# Patient Record
Sex: Male | Born: 2000
Health system: Southern US, Community
[De-identification: ages and names within clinical notes are randomized; demographics above are authoritative.]

## PROBLEM LIST (undated history)

## (undated) DIAGNOSIS — F909 Attention-deficit hyperactivity disorder, unspecified type: Secondary | ICD-10-CM

## (undated) DIAGNOSIS — J189 Pneumonia, unspecified organism: Secondary | ICD-10-CM

---
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
## 2020-03-25 ENCOUNTER — Emergency Department (HOSPITAL_BASED_OUTPATIENT_CLINIC_OR_DEPARTMENT_OTHER)
Admission: EM | Admit: 2020-03-25 | Discharge: 2020-03-25 | Disposition: A | Discharge status: Home / Self Care | Payer: 59 | Attending: Emergency Medicine | Admitting: Emergency Medicine

## 2020-03-25 ENCOUNTER — Other Ambulatory Visit: Payer: Self-pay

## 2020-03-25 ENCOUNTER — Encounter (HOSPITAL_BASED_OUTPATIENT_CLINIC_OR_DEPARTMENT_OTHER): Payer: Self-pay | Admitting: *Deleted

## 2020-03-25 DIAGNOSIS — Z202 Contact with and (suspected) exposure to infections with a predominantly sexual mode of transmission: Secondary | ICD-10-CM | POA: Diagnosis not present

## 2020-03-25 LAB — URINALYSIS, ROUTINE W REFLEX MICROSCOPIC
Bilirubin Urine: NEGATIVE
Glucose, UA: NEGATIVE mg/dL
Hgb urine dipstick: NEGATIVE
Ketones, ur: NEGATIVE mg/dL
Leukocytes,Ua: NEGATIVE
Nitrite: NEGATIVE
Protein, ur: NEGATIVE mg/dL
Specific Gravity, Urine: 1.02 (ref 1.005–1.030)
pH: 7.5 (ref 5.0–8.0)

## 2020-03-25 MED ORDER — CEFTRIAXONE SODIUM 500 MG IJ SOLR
500.0000 mg | Freq: Once | INTRAMUSCULAR | Status: AC
  Administered 2020-03-25: 500 mg via INTRAMUSCULAR
  Filled 2020-03-25: qty 500

## 2020-03-25 MED ORDER — DOXYCYCLINE HYCLATE 100 MG PO CAPS: 100.0000 mg | ORAL_CAPSULE | Freq: Two times a day (BID) | ORAL | 0 refills | Status: AC

## 2020-03-25 MED ORDER — LIDOCAINE HCL (PF) 1 % IJ SOLN
1.0000 mL | Freq: Once | INTRAMUSCULAR | Status: AC
  Administered 2020-03-25: 1 mL
  Filled 2020-03-25: qty 5

## 2020-03-25 NOTE — Discharge Instructions (Addendum)
.  You been seen here for STD exposure.  I have treated you for gonorrhea and chlamydia, you must abstain from sexual intercourse for the next 7 days.  I recommend safe sex practices to prevent future exposures.    If you have a future exposure he can follow-up at the local health department as they can screen and treat you.  Come back to the emergency department if you develop chest pain, shortness of breath, severe abdominal pain, uncontrolled nausea, vomiting, diarrhea.

## 2020-03-25 NOTE — ED Provider Notes (Signed)
[facility] EMERGENCY DEPARTMENT Provider Note   CSN: [id number] Arrival date & time: 03/25/20  1448     History Chief Complaint  Patient presents with  . Exposure to STD    Kenneth Roman is a 20 y.o. male.  HPI   Patient with no significant medical history presents to the emergency department with chief complaint of STD exposure.  Patient endorses that his partner recently tested positive for chlamydia and he was instructed to  come get checked out.  He denies any symptoms at this time, he denies penile discharge, testicular pain, urinary urgency, frequency, hematuria, dysuria, abdominal pain, flank pain, nausea or vomiting.  He states he is really here just to get tested and treated if necessary.  He denies alleviating factors, he is not immunocompromise.  Patient denies headaches, fevers, chills, shortness of breath, chest pain, abdominal pain, urinary symptoms, pedal edema.  Past Medical History:  Diagnosis Date  . ADHD (attention deficit hyperactivity disorder)   . Pneumonia     There are no problems to display for this patient.   History reviewed. No pertinent surgical history.     No family history on file.  Social History   Tobacco Use  . Smoking status: Never Smoker  Substance Use Topics  . Alcohol use: No  . Drug use: No    Home Medications Prior to Admission medications   Medication Sig Start Date End Date Taking? Authorizing Provider  doxycycline (VIBRAMYCIN) 100 MG capsule Take 1 capsule (100 mg total) by mouth 2 (two) times daily for 7 days. 03/25/20 04/01/20 Yes Carroll Sage, PA-C  azithromycin [facility]) 200 MG/5ML suspension Take 350mg  (9 mL) day 1, then 175 mg (4.5 mL) daily for the next 4 days. 04/05/12   04/07/12, MD  dextromethorphan-guaiFENesin (MUCINEX DM) 30-600 MG 12hr tablet Take 1 tablet by mouth 2 (two) times daily. 03/02/18   Law, 03/04/18, PA-C  guaiFENesin-codeine (ROBITUSSIN AC) 100-10 MG/5ML syrup Take 5 mLs  by mouth 3 (three) times daily as needed for cough. 04/05/12   04/07/12, MD  HYDROcodone-acetaminophen (NORCO/VICODIN) 5-325 MG per tablet Take 1 tablet by mouth every 4 (four) hours as needed. 02/24/13   02/26/13, PA-C  methylphenidate (CONCERTA) 27 MG CR tablet Take 27 mg by mouth every morning.    [provider]    Allergies    Patient has no known allergies.  Review of Systems   Review of Systems  Constitutional: Negative for chills and fever.  HENT: Negative for congestion.   Respiratory: Negative for shortness of breath.   Cardiovascular: Negative for chest pain.  Gastrointestinal: Negative for abdominal pain.  Genitourinary: Negative for dysuria, enuresis, penile swelling, scrotal swelling and testicular pain.  Musculoskeletal: Negative for back pain.  Skin: Negative for rash.  Neurological: Negative for dizziness.  Hematological: Does not bruise/bleed easily.    Physical Exam Updated Vital Signs BP 124/78   Pulse 63   Temp 98.5 F (36.9 C) (Oral)   Resp 16   Ht 6\' 1"  (1.854 m)   Wt 72.6 kg   SpO2 100%   BMI 21.11 kg/m   Physical Exam Vitals and nursing note reviewed. Exam conducted with a chaperone present.  Constitutional:      General: He is not in acute distress.    Appearance: He is not ill-appearing.  HENT:     Head: Normocephalic and atraumatic.     Nose: No congestion.  Eyes:     Conjunctiva/sclera: Conjunctivae normal.  Cardiovascular:     Rate and Rhythm: Normal rate.  Pulmonary:     Effort: Pulmonary effort is normal.  Genitourinary:    Penis: Normal.      Testes: Normal.     Comments: With chaperone present genital exam was performed, there is no penile discharge or drainage present, there is no rash or gross abnormalities noted around the genitalia.  Testicles were palpated they are nontender to palpation.  Skin:    General: Skin is warm and dry.  Neurological:     Mental Status: He is alert.  Psychiatric:        Mood  and Affect: Mood normal.     ED Results / Procedures / Treatments   Labs (all labs ordered are listed, but only abnormal results are displayed) Labs Reviewed  URINALYSIS, ROUTINE W REFLEX MICROSCOPIC  RPR  GC/CHLAMYDIA PROBE AMP ([place]) NOT AT [facility]    EKG None  Radiology No results found.  Procedures Procedures (including critical care time)  Medications Ordered in ED Medications  cefTRIAXone (ROCEPHIN) injection 500 mg (500 mg Intramuscular Given 03/25/20 1645)  lidocaine (PF) (XYLOCAINE) 1 % injection 1 mL (1 mL Other Given 03/25/20 1645)    ED Course  I have reviewed the triage vital signs and the nursing notes.  Pertinent labs & imaging results that were available during my care of the patient were reviewed by me and considered in my medical decision making (see chart for details).    MDM Rules/Calculators/A&P                          Patient presents with STD exposure.  He is alert, does not appear in acute distress, vital signs reassuring.  Will obtain UA and gonorrhea and Chlamydia testing.  We will treat patient prophylactically as he has a high risk exposure.  UA negative for infection.  Gonorrhea and Chlamydia tests being pending at this time.  Low suspicion for pyelonephritis or UTI as patient denies urinary symptoms, lower back pain, nausea vomiting fevers or chills.  Low suspicion for epididymitis or prostatitis as patient denies saddle pain, testicles are nontender.  Low suspicion for herpes as there is no noted rash on patient's genitalia.  Low suspicion for STD as patient denies penile discharge, testicular pain, or rash around the genitalia.  Low suspicion for systemic infection as patient is nontoxic-appearing, vital signs reassuring, no obvious source infection noted on exam.  Will treat patient prophylactically for STD as he had a high risk exposure.  Will recommend patient abstain from sexual intercourse for the next 7 days and practice safe  sex.  Vital signs have remained stable, no indication for hospital admission.  Patient discussed with attending and they agreed with assessment and plan.  Patient given at home care as well strict return precautions.  Patient verbalized that they understood agreed to said plan.   Final Clinical Impression(s) / ED Diagnoses Final diagnoses:  STD exposure    Rx / DC Orders ED Discharge Orders         Ordered    doxycycline (VIBRAMYCIN) 100 MG capsule  2 times daily        03/25/20 1641           Barnie Del 03/25/20 1713    Pollyann Savoy, MD 03/25/20 1725

## 2020-03-25 NOTE — ED Notes (Signed)
States rec information from girlfriend 3 days ago that she was diagnosis with Chlamydia by her GYN MD. States having no signs or symptoms, or pain at all.

## 2020-03-25 NOTE — ED Triage Notes (Signed)
C/o std exposure chlamydia

## 2020-03-25 NOTE — ED Notes (Signed)
AVS reviewed with pt, discussed Rx by ED PA and importance of taking all of med prescribed. Discussed safe sec practices. Opportunity for questions provided as well

## 2020-03-27 LAB — GC/CHLAMYDIA PROBE AMP (~~LOC~~) NOT AT ARMC
Chlamydia: POSITIVE — AB
Comment: NEGATIVE
Comment: NORMAL
Neisseria Gonorrhea: NEGATIVE
